# Patient Record
Sex: Female | Born: 1980 | Race: White | Hispanic: No | Marital: Single | Smoking: Never smoker
Health system: Southern US, Community
[De-identification: ages and names within clinical notes are randomized; demographics above are authoritative.]

---

## 2018-06-02 ENCOUNTER — Other Ambulatory Visit: Payer: Self-pay

## 2018-06-02 ENCOUNTER — Emergency Department: Payer: BC Managed Care – PPO

## 2018-06-02 ENCOUNTER — Emergency Department
Admission: EM | Admit: 2018-06-02 | Discharge: 2018-06-02 | Disposition: A | Discharge status: Home / Self Care | Payer: BC Managed Care – PPO | Attending: Emergency Medicine | Admitting: Emergency Medicine

## 2018-06-02 ENCOUNTER — Encounter: Payer: Self-pay | Admitting: Emergency Medicine

## 2018-06-02 DIAGNOSIS — S161XXA Strain of muscle, fascia and tendon at neck level, initial encounter: Secondary | ICD-10-CM

## 2018-06-02 DIAGNOSIS — G44309 Post-traumatic headache, unspecified, not intractable: Secondary | ICD-10-CM

## 2018-06-02 DIAGNOSIS — G44319 Acute post-traumatic headache, not intractable: Secondary | ICD-10-CM | POA: Diagnosis not present

## 2018-06-02 DIAGNOSIS — Y998 Other external cause status: Secondary | ICD-10-CM | POA: Diagnosis not present

## 2018-06-02 DIAGNOSIS — Y92410 Unspecified street and highway as the place of occurrence of the external cause: Secondary | ICD-10-CM | POA: Insufficient documentation

## 2018-06-02 DIAGNOSIS — Y9389 Activity, other specified: Secondary | ICD-10-CM | POA: Insufficient documentation

## 2018-06-02 DIAGNOSIS — R1011 Right upper quadrant pain: Secondary | ICD-10-CM | POA: Diagnosis not present

## 2018-06-02 DIAGNOSIS — S199XXA Unspecified injury of neck, initial encounter: Secondary | ICD-10-CM | POA: Diagnosis present

## 2018-06-02 LAB — CBC WITH DIFFERENTIAL/PLATELET
ABS IMMATURE GRANULOCYTES: 0.03 10*3/uL (ref 0.00–0.07)
Abs Immature Granulocytes: 0.03 10*3/uL (ref 0.00–0.07)
Basophils Absolute: 0.1 10*3/uL (ref 0.0–0.1)
Basophils Absolute: 0.1 10*3/uL (ref 0.0–0.1)
Basophils Relative: 0 %
Basophils Relative: 1 %
EOS ABS: 0 10*3/uL (ref 0.0–0.5)
Eosinophils Absolute: 0 10*3/uL (ref 0.0–0.5)
Eosinophils Relative: 0 %
Eosinophils Relative: 0 %
HCT: 39.4 % (ref 36.0–46.0)
HCT: 37 % (ref 36.0–46.0)
Hemoglobin: 12.8 g/dL (ref 12.0–15.0)
Hemoglobin: 12 g/dL (ref 12.0–15.0)
IMMATURE GRANULOCYTES: 0 %
Immature Granulocytes: 0 %
Lymphocytes Relative: 34 %
Lymphocytes Relative: 32 %
Lymphs Abs: 4 10*3/uL (ref 0.7–4.0)
Lymphs Abs: 3.5 10*3/uL (ref 0.7–4.0)
MCH: 31 pg (ref 26.0–34.0)
MCH: 31 pg (ref 26.0–34.0)
MCHC: 32.5 g/dL (ref 30.0–36.0)
MCHC: 32.4 g/dL (ref 30.0–36.0)
MCV: 95.4 fL (ref 80.0–100.0)
MCV: 95.6 fL (ref 80.0–100.0)
Monocytes Absolute: 0.5 10*3/uL (ref 0.1–1.0)
Monocytes Absolute: 0.5 10*3/uL (ref 0.1–1.0)
Monocytes Relative: 4 %
Monocytes Relative: 4 %
NEUTROS PCT: 63 %
Neutro Abs: 7.1 10*3/uL (ref 1.7–7.7)
Neutro Abs: 6.8 10*3/uL (ref 1.7–7.7)
Neutrophils Relative %: 62 %
PLATELETS: 353 10*3/uL (ref 150–400)
Platelets: 340 10*3/uL (ref 150–400)
RBC: 4.13 MIL/uL (ref 3.87–5.11)
RBC: 3.87 MIL/uL (ref 3.87–5.11)
RDW: 12.8 % (ref 11.5–15.5)
RDW: 12.9 % (ref 11.5–15.5)
WBC: 11.6 10*3/uL — ABNORMAL HIGH (ref 4.0–10.5)
WBC: 10.8 10*3/uL — ABNORMAL HIGH (ref 4.0–10.5)
nRBC: 0 % (ref 0.0–0.2)
nRBC: 0 % (ref 0.0–0.2)

## 2018-06-02 LAB — BASIC METABOLIC PANEL
Anion gap: 7 (ref 5–15)
BUN: 10 mg/dL (ref 6–20)
CO2: 23 mmol/L (ref 22–32)
Calcium: 8.9 mg/dL (ref 8.9–10.3)
Chloride: 105 mmol/L (ref 98–111)
Creatinine, Ser: 0.83 mg/dL (ref 0.44–1.00)
GFR calc Af Amer: >60 mL/min (ref >60)
GFR calc non Af Amer: >60 mL/min (ref >60)
Glucose, Bld: 103 mg/dL — ABNORMAL HIGH (ref 70–99)
POTASSIUM: 3.5 mmol/L (ref 3.5–5.1)
Sodium: 135 mmol/L (ref 135–145)

## 2018-06-02 LAB — POCT PREGNANCY, URINE: Preg Test, Ur: NEGATIVE

## 2018-06-02 MED ORDER — MELOXICAM 15 MG PO TABS: 15.0000 mg | ORAL_TABLET | Freq: Every day | ORAL | 2 refills | Status: AC

## 2018-06-02 MED ORDER — IOPAMIDOL (ISOVUE-300) INJECTION 61%
100.0000 mL | Freq: Once | INTRAVENOUS | Status: AC | PRN
  Administered 2018-06-02: 100 mL via INTRAVENOUS
  Filled 2018-06-02: qty 100

## 2018-06-02 MED ORDER — KETOROLAC TROMETHAMINE 30 MG/ML IJ SOLN
30.0000 mg | Freq: Once | INTRAMUSCULAR | Status: DC
  Filled 2018-06-02: qty 1

## 2018-06-02 MED ORDER — BACLOFEN 10 MG PO TABS: 10.0000 mg | ORAL_TABLET | Freq: Three times a day (TID) | ORAL | 1 refills | Status: AC

## 2018-06-02 MED ORDER — CYCLOBENZAPRINE HCL 10 MG PO TABS
10.0000 mg | ORAL_TABLET | Freq: Once | ORAL | Status: AC
  Administered 2018-06-02: 10 mg via ORAL
  Filled 2018-06-02: qty 1

## 2018-06-02 NOTE — ED Notes (Signed)
Pt denies hitting head but has HA, pupils even and reactive, pt c/o dizziness, and light sensitivity.

## 2018-06-02 NOTE — ED Notes (Signed)
Urine preg neg.  
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
## 2018-06-02 NOTE — ED Notes (Addendum)
2nd grn/lav sent to lab per lab request.
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
## 2019-12-08 ENCOUNTER — Other Ambulatory Visit: Payer: Self-pay | Admitting: Obstetrics & Gynecology

## 2019-12-08 DIAGNOSIS — Z1231 Encounter for screening mammogram for malignant neoplasm of breast: Secondary | ICD-10-CM

## 2020-11-17 ENCOUNTER — Other Ambulatory Visit: Payer: Self-pay | Admitting: Internal Medicine

## 2020-11-17 DIAGNOSIS — Z1231 Encounter for screening mammogram for malignant neoplasm of breast: Secondary | ICD-10-CM

## 2021-01-20 ENCOUNTER — Other Ambulatory Visit: Payer: Self-pay

## 2021-01-20 ENCOUNTER — Ambulatory Visit
Admission: RE | Admit: 2021-01-20 | Discharge: 2021-01-20 | Disposition: A | Discharge status: Home / Self Care | Payer: BC Managed Care – PPO | Source: Ambulatory Visit | Attending: Internal Medicine | Admitting: Internal Medicine

## 2021-01-20 DIAGNOSIS — Z1231 Encounter for screening mammogram for malignant neoplasm of breast: Secondary | ICD-10-CM

## 2022-01-22 ENCOUNTER — Encounter: Payer: Self-pay | Admitting: Psychology

## 2022-06-12 IMAGING — MG MM DIGITAL SCREENING BILAT W/ TOMO AND CAD
8 series · 8 of 24 positions shown · non-contrast
Comparison: None.

CLINICAL DATA: Screening.

EXAM:
DIGITAL SCREENING BILATERAL MAMMOGRAM WITH TOMOSYNTHESIS AND CAD
TECHNIQUE: Bilateral screening digital craniocaudal and mediolateral oblique
mammograms were obtained. Bilateral screening digital breast
tomosynthesis was performed. The images were evaluated with
computer-aided detection.

[L MLO synth-2D]
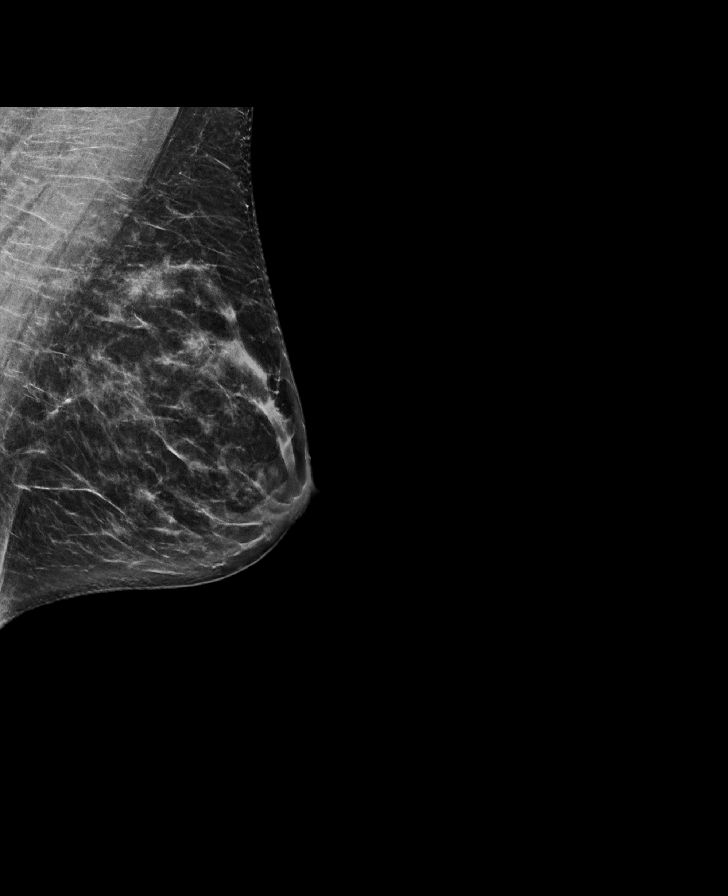

[R MLO synth-2D]
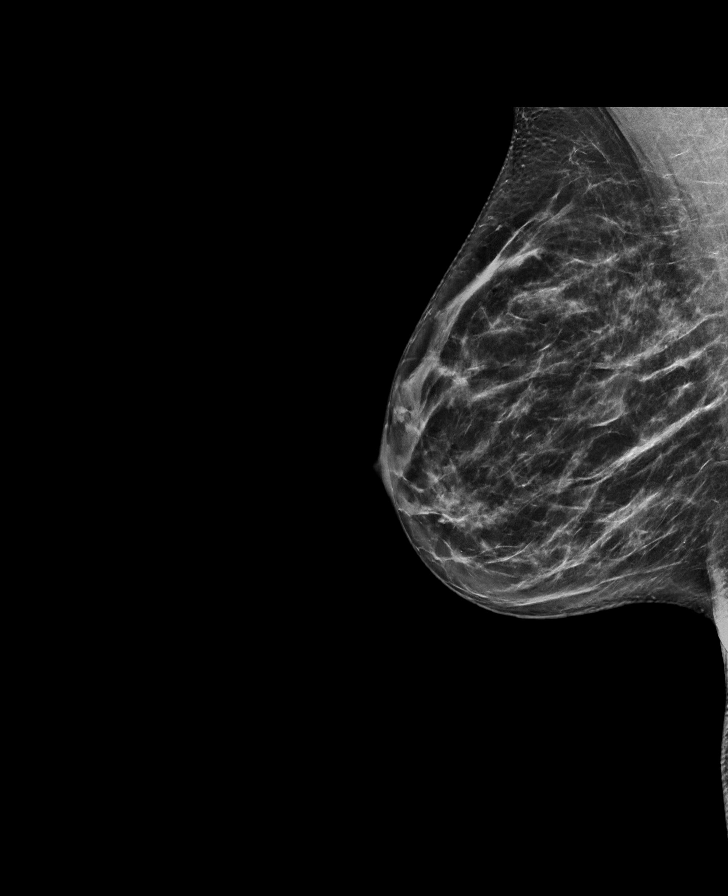

[R CC synth-2D]
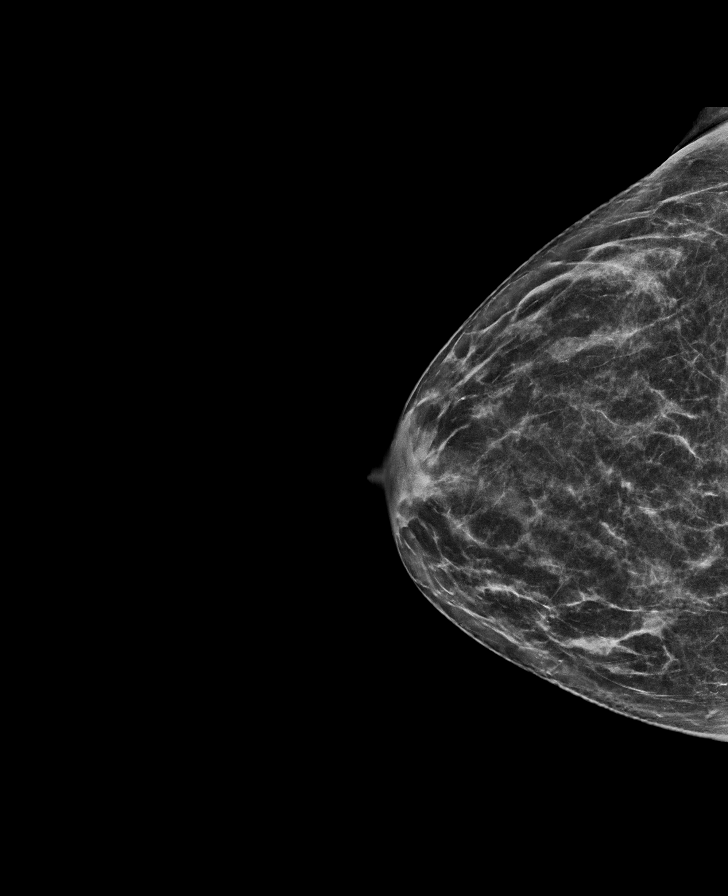

[L CC synth-2D]
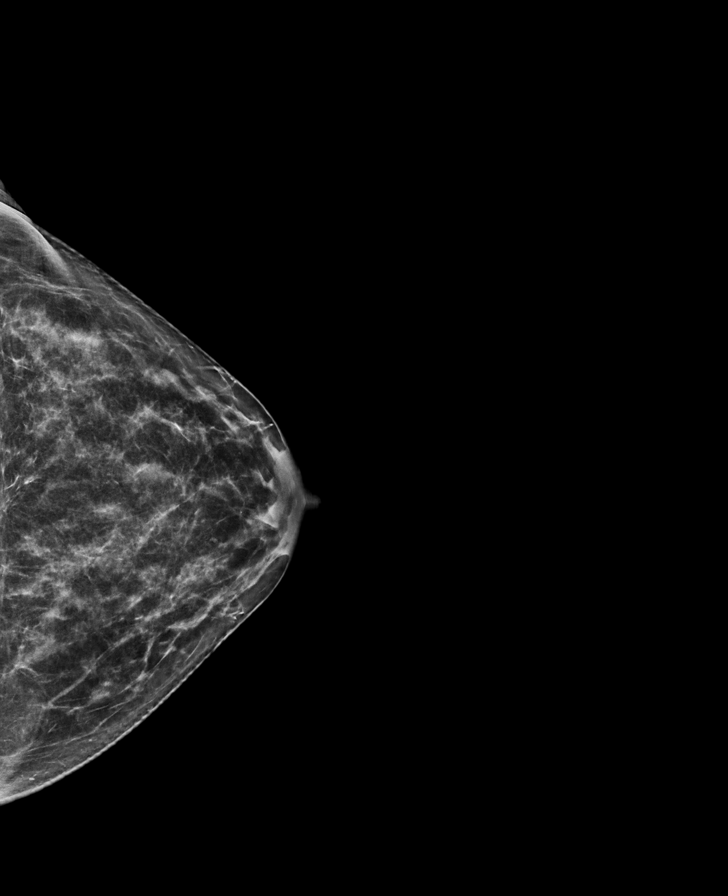

[R CC tomo · tomo slice 34/67.0]
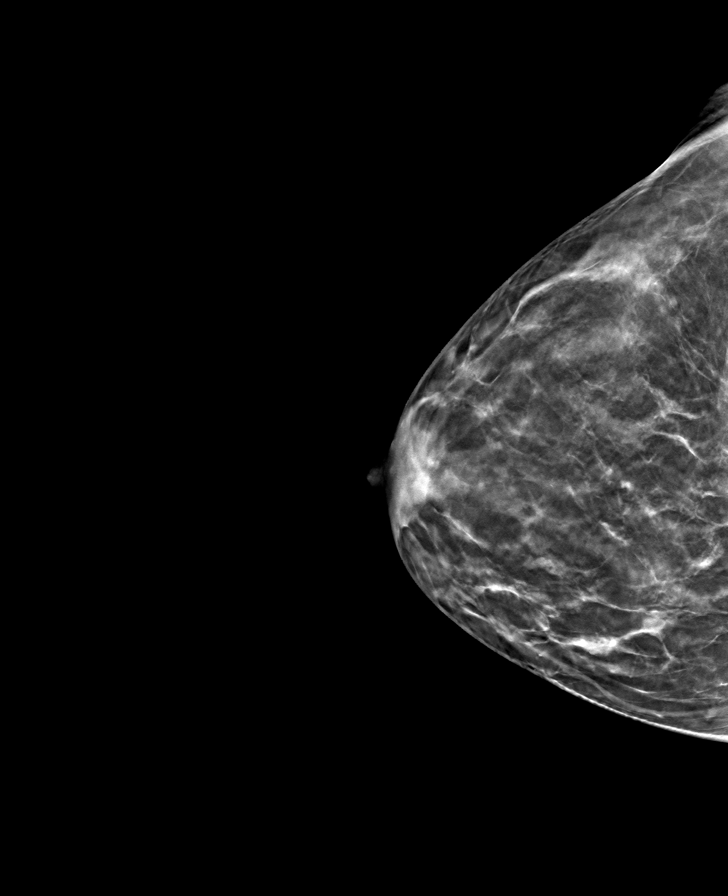

[L CC tomo · tomo slice 35/68.0]
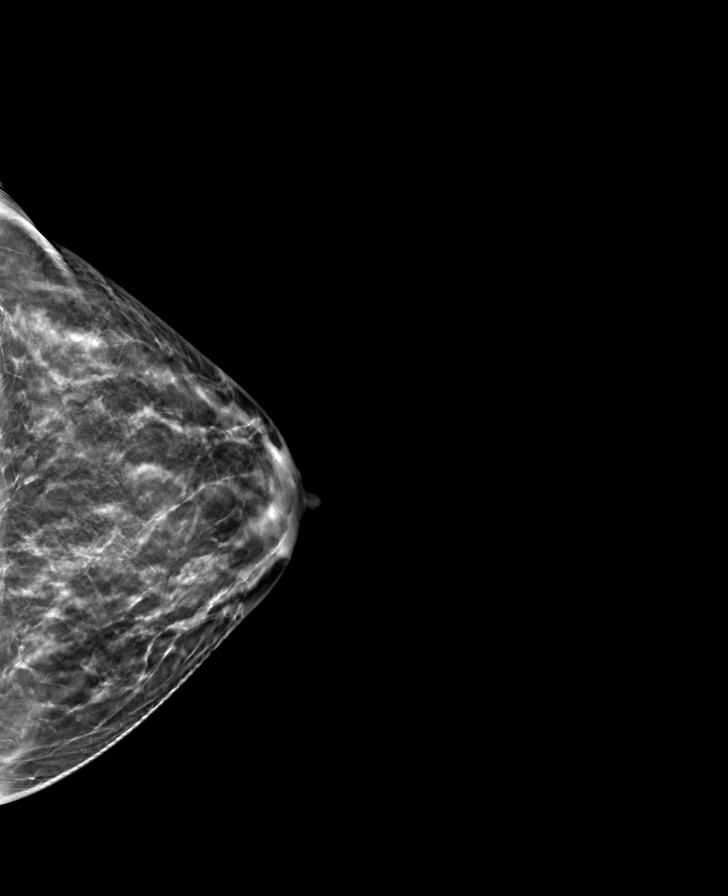

[L MLO tomo · tomo slice 37/74.0]
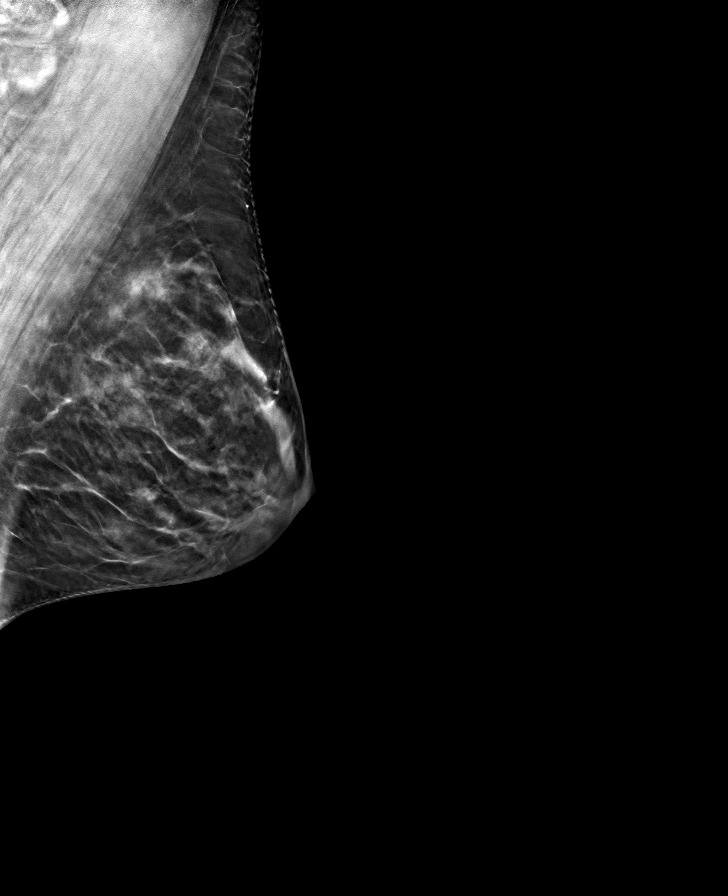

[R MLO tomo · tomo slice 36/71.0]
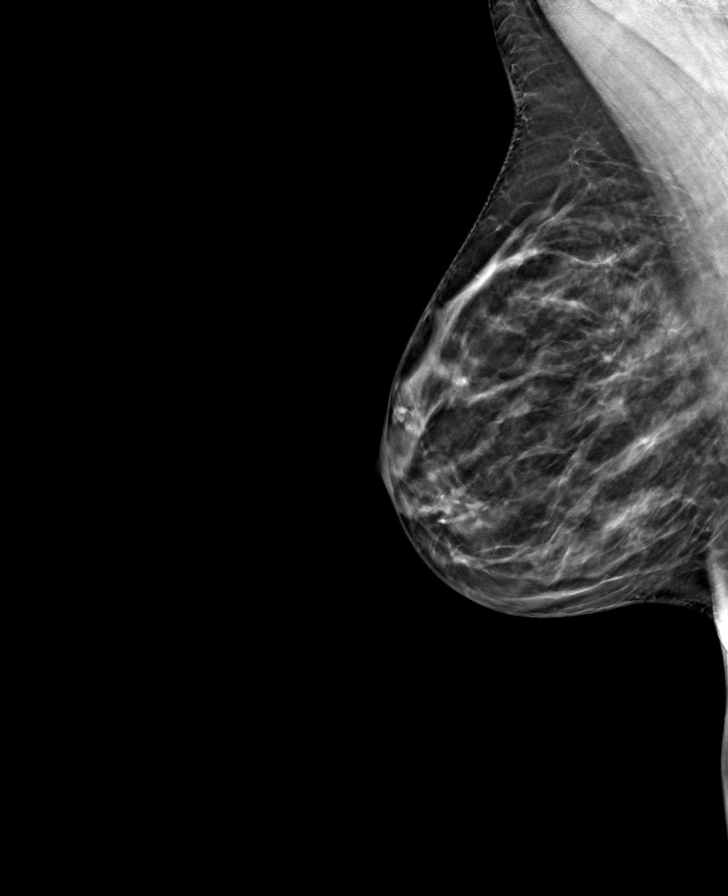

[8 of 24 positions shown; findings below may reference images not displayed]

ACR Breast Density Category b: There are scattered areas of
fibroglandular density.
FINDINGS: There are no findings suspicious for malignancy.
IMPRESSION: No mammographic evidence of malignancy. A result letter of this
screening mammogram will be mailed directly to the patient.

RECOMMENDATION:
Screening mammogram in one year. (Code:[id number]-X-X7B)

BI-RADS CATEGORY  1: Negative.

## 2022-08-08 ENCOUNTER — Encounter: Payer: Self-pay | Admitting: Psychology

## 2022-08-08 ENCOUNTER — Encounter: Payer: BC Managed Care – PPO | Attending: Psychology | Admitting: Psychology

## 2022-08-08 DIAGNOSIS — R413 Other amnesia: Secondary | ICD-10-CM | POA: Insufficient documentation

## 2022-08-08 DIAGNOSIS — F8181 Disorder of written expression: Secondary | ICD-10-CM | POA: Diagnosis present

## 2022-08-08 DIAGNOSIS — R41840 Attention and concentration deficit: Secondary | ICD-10-CM | POA: Diagnosis present
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
## 2022-08-23 ENCOUNTER — Encounter: Payer: BC Managed Care – PPO | Attending: Psychology

## 2022-08-23 DIAGNOSIS — F8181 Disorder of written expression: Secondary | ICD-10-CM | POA: Insufficient documentation

## 2022-08-23 DIAGNOSIS — R41840 Attention and concentration deficit: Secondary | ICD-10-CM | POA: Insufficient documentation

## 2022-08-23 DIAGNOSIS — R413 Other amnesia: Secondary | ICD-10-CM | POA: Insufficient documentation

## 2022-08-23 NOTE — Progress Notes (Signed)
   Behavioral Observations: The patient appeared well-groomed and appropriately dressed. Her manners were polite and appropriate to the situation. The patient's attitude towards testing was positive and her effort was good.  Neuropsychology Note  [facility] completed 90 minutes of neuropsychological testing with technician, Wandra Scot, BA, under the supervision of Ilean Skill, PsyD., Clinical Neuropsychologist. The patient did not appear overtly distressed by the testing session, per behavioral observation or via self-report to the technician. Rest breaks were offered.   Clinical Decision Making: In considering the patient's current level of functioning, level of presumed impairment, nature of symptoms, emotional and behavioral responses during clinical interview, level of literacy, and observed level of motivation/effort, a battery of tests was selected by Dr. Sima Matas during initial consultation on 08/08/2022. This was communicated to the technician. Communication between the neuropsychologist and technician was ongoing throughout the testing session and changes were made as deemed necessary based on patient performance on testing, technician observations and additional pertinent factors such as those listed above.  Tests Administered: Comprehensive Attention Battery (CAB) Continuous Performance Test (CPT)   Results: Will be included in final report   Feedback to Patient: Summer Butler will return on 05/15/2023 for an interactive feedback session with Dr. Sima Matas at which time her test performances, clinical impressions and treatment recommendations will be reviewed in detail. The patient understands she can contact our office should she require our assistance before this time.  90 minutes spent face-to-face with patient administering standardized tests, 30 minutes spent scoring Environmental education officer). [CPT [id number], [id number]]  Full report to follow.

## 2023-03-25 ENCOUNTER — Encounter: Payer: BC Managed Care – PPO | Attending: Psychology | Admitting: Psychology

## 2023-03-25 ENCOUNTER — Encounter: Payer: Self-pay | Admitting: Psychology

## 2023-03-25 DIAGNOSIS — F908 Attention-deficit hyperactivity disorder, other type: Secondary | ICD-10-CM | POA: Diagnosis not present
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
## 2023-03-27 ENCOUNTER — Other Ambulatory Visit: Payer: Self-pay

## 2023-03-27 ENCOUNTER — Emergency Department (HOSPITAL_COMMUNITY): Payer: BC Managed Care – PPO

## 2023-03-27 ENCOUNTER — Emergency Department (HOSPITAL_COMMUNITY)
Admission: EM | Admit: 2023-03-27 | Discharge: 2023-03-28 | Disposition: A | Discharge status: Home / Self Care | Payer: BC Managed Care – PPO | Attending: Emergency Medicine | Admitting: Emergency Medicine

## 2023-03-27 DIAGNOSIS — W260XXA Contact with knife, initial encounter: Secondary | ICD-10-CM | POA: Diagnosis not present

## 2023-03-27 DIAGNOSIS — Y93D9 Activity, other involving arts and handcrafts: Secondary | ICD-10-CM | POA: Diagnosis not present

## 2023-03-27 DIAGNOSIS — S61411A Laceration without foreign body of right hand, initial encounter: Secondary | ICD-10-CM | POA: Insufficient documentation

## 2023-03-27 DIAGNOSIS — S6991XA Unspecified injury of right wrist, hand and finger(s), initial encounter: Secondary | ICD-10-CM | POA: Diagnosis present

## 2023-03-27 NOTE — ED Triage Notes (Signed)
Patient presents with approx. 2" skin laceration at proximal right thumb/palm area sustained this evening while carving a pumpkin , minimal bleeding ,dressing applied at triage .

## 2023-03-28 MED ORDER — LIDOCAINE-EPINEPHRINE (PF) 2 %-1:200000 IJ SOLN
10.0000 mL | Freq: Once | INTRAMUSCULAR | Status: AC
  Administered 2023-03-28: 10 mL
  Filled 2023-03-28: qty 20

## 2023-03-28 MED ORDER — BACITRACIN ZINC 500 UNIT/GM EX OINT
TOPICAL_OINTMENT | Freq: Two times a day (BID) | CUTANEOUS | Status: DC
  Administered 2023-03-28: 1 via TOPICAL
  Filled 2023-03-28: qty 0.9

## 2023-03-28 NOTE — Discharge Instructions (Signed)
You x-ray looked good.  Keep the wound clean and dry.  Wear the splint for the first few days.  Follow-up with your doctor in ~10 days for suture removal.  If you have any perceived weakness or significant deep pain after 5-7 days, you should follow-up with the hand doctor listed.

## 2023-03-28 NOTE — ED Provider Notes (Signed)
[facility]ENC[facility] DEPT [facility] Emergency Department Provider Note MRN:  [id number]  Arrival date & time: 03/28/23     Chief Complaint   Laceration  (Right Hand)   History of Present Illness   Summer Butler is a 42 y.o. year-old female presents to the ED with chief complaint of right hand laceration.  She was carving pumpkins with an [employer] knife and sliced her right hand near the thumb.  Bleeding was controlled with gauze.  She denies any other injuries.  History provided by patient.   Review of Systems  Pertinent positive and negative review of systems noted in HPI.    Physical Exam   Vitals:   03/27/23 2138 03/28/23 0233  BP: (!) 117/96 122/82  Pulse: 79 71  Resp: 16 18  Temp: 98.2 F (36.8 C) 98.1 F (36.7 C)  SpO2: 100% 98%    CONSTITUTIONAL:  well-appearing, NAD NEURO:  Alert and oriented x 3, CN 3-12 grossly intact EYES:  eyes equal and reactive ENT/NECK:  Supple, no stridor  CARDIO:  appears well-perfused, intact cap refill PULM:  No respiratory distress,  GI/GU:  non-distended,  MSK/SPINE:  No gross deformities, no edema, moves all extremities, normal ROM and strength of right thumb SKIN:  no rash, atraumatic, 3 cm linear laceration to the right hand near the base of the thumb, there is a small slice in the underlying muscle belly, but no visible tendon disruption   *Additional and/or pertinent findings included in MDM below  Diagnostic and Interventional Summary    EKG Interpretation Date/Time:    Ventricular Rate:    PR Interval:    QRS Duration:    QT Interval:    QTC Calculation:   R Axis:      Text Interpretation:         Labs Reviewed - No data to display  DG Hand Complete Right  Final Result      Medications  bacitracin ointment (has no administration in time range)  lidocaine-EPINEPHrine (XYLOCAINE W/EPI) 2 %-1:200000 (PF) injection 10 mL (10 mLs Infiltration Given by Other 03/28/23 4098)     Procedures  /  Critical  Care .Marland KitchenLaceration Repair  Date/Time: 03/28/2023 3:51 AM  Performed by: Roxy Horseman, PA-C Authorized by: Roxy Horseman, PA-C   Consent:    Consent obtained:  Verbal   Consent given by:  Patient   Risks, benefits, and alternatives were discussed: yes     Risks discussed:  Pain, tendon damage and vascular damage   Alternatives discussed:  No treatment Universal protocol:    Procedure explained and questions answered to patient or proxy's satisfaction: yes     Relevant documents present and verified: yes     Test results available: yes     Imaging studies available: yes     Required blood products, implants, devices, and special equipment available: yes     Site/side marked: yes     Immediately prior to procedure, a time out was called: yes     Patient identity confirmed:  Verbally with patient Anesthesia:    Anesthesia method:  Local infiltration   Local anesthetic:  Lidocaine 1% WITH epi Laceration details:    Location:  Hand   Hand location:  R hand, dorsum   Length (cm):  3 Exploration:    Contaminated: no   Treatment:    Area cleansed with:  Saline   Amount of cleaning:  Standard Skin repair:    Repair method:  Sutures   Suture size:  4-0  Suture material:  Prolene   Suture technique:  Simple interrupted   Number of sutures:  5 Approximation:    Approximation:  Close Repair type:    Repair type:  Simple Post-procedure details:    Dressing:  Antibiotic ointment, splint for protection and non-adherent dressing   Procedure completion:  Tolerated well, no immediate complications   ED Course and Medical Decision Making  I have reviewed the triage vital signs, the nursing notes, and pertinent available records from the EMR.  Social Determinants Affecting Complexity of Care: Patient has no clinically significant social determinants affecting this chief complaint..   ED Course:    Medical Decision Making Amount and/or Complexity of Data  Reviewed Radiology: ordered.  Risk OTC drugs. Prescription drug management.         Consultants: No consultations were needed in caring for this patient.   Treatment and Plan: Emergency department workup does not suggest an emergent condition requiring admission or immediate intervention beyond  what has been performed at this time. The patient is safe for discharge and has  been instructed to return immediately for worsening symptoms, change in  symptoms or any other concerns    Final Clinical Impressions(s) / ED Diagnoses     ICD-10-CM   1. Laceration of right hand, foreign body presence unspecified, initial encounter  X32.440N       ED Discharge Orders     None         Discharge Instructions Discussed with and Provided to Patient:    Discharge Instructions      You x-ray looked good.  Keep the wound clean and dry.  Wear the splint for the first few days.  Follow-up with your doctor in ~10 days for suture removal.  If you have any perceived weakness or significant deep pain after 5-7 days, you should follow-up with the hand doctor listed.      Roxy Horseman, PA-C 03/28/23 0354    Gilda Crease, MD 03/28/23 [id number]

## 2023-05-07 ENCOUNTER — Ambulatory Visit: Payer: BC Managed Care – PPO | Admitting: Psychology

## 2023-05-15 ENCOUNTER — Encounter: Payer: BC Managed Care – PPO | Attending: Psychology | Admitting: Psychology

## 2023-05-15 ENCOUNTER — Encounter: Payer: Self-pay | Admitting: Psychology

## 2023-05-15 DIAGNOSIS — F908 Attention-deficit hyperactivity disorder, other type: Secondary | ICD-10-CM | POA: Diagnosis present

## 2023-05-15 DIAGNOSIS — F8181 Disorder of written expression: Secondary | ICD-10-CM | POA: Insufficient documentation
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
## 2023-12-12 ENCOUNTER — Other Ambulatory Visit: Payer: Self-pay | Admitting: Internal Medicine

## 2023-12-12 DIAGNOSIS — Z1231 Encounter for screening mammogram for malignant neoplasm of breast: Secondary | ICD-10-CM

## 2024-01-03 ENCOUNTER — Ambulatory Visit
Admission: RE | Admit: 2024-01-03 | Discharge: 2024-01-03 | Disposition: A | Discharge status: Home / Self Care | Payer: Self-pay | Source: Ambulatory Visit | Attending: Internal Medicine | Admitting: Internal Medicine

## 2024-01-03 DIAGNOSIS — Z1231 Encounter for screening mammogram for malignant neoplasm of breast: Secondary | ICD-10-CM
